# Patient Record
Sex: Female | Born: 1960 | Race: Black or African American | Hispanic: No | Smoking: Current every day smoker
Health system: Southern US, Community
[De-identification: ages and names within clinical notes are randomized; demographics above are authoritative.]

## PROBLEM LIST (undated history)

## (undated) HISTORY — PX: ABDOMINAL HYSTERECTOMY: SHX81

## (undated) HISTORY — PX: CHOLECYSTECTOMY: SHX55

## (undated) HISTORY — PX: APPENDECTOMY: SHX54

---

## 2001-01-24 ENCOUNTER — Other Ambulatory Visit: Admission: RE | Admit: 2001-01-24 | Discharge: 2001-01-24 | Payer: Self-pay | Admitting: Internal Medicine

## 2001-01-28 ENCOUNTER — Ambulatory Visit (HOSPITAL_COMMUNITY): Admission: RE | Admit: 2001-01-28 | Discharge: 2001-01-28 | Payer: Self-pay | Admitting: Internal Medicine

## 2001-01-28 ENCOUNTER — Encounter: Payer: Self-pay | Admitting: Internal Medicine

## 2001-02-06 ENCOUNTER — Inpatient Hospital Stay (HOSPITAL_COMMUNITY): Admission: RE | Admit: 2001-02-06 | Discharge: 2001-02-08 | Payer: Self-pay | Admitting: Internal Medicine

## 2001-02-09 ENCOUNTER — Inpatient Hospital Stay (HOSPITAL_COMMUNITY): Admission: EM | Admit: 2001-02-09 | Discharge: 2001-02-12 | Payer: Self-pay | Admitting: Internal Medicine

## 2001-02-09 ENCOUNTER — Encounter: Payer: Self-pay | Admitting: Internal Medicine

## 2001-02-10 ENCOUNTER — Encounter: Payer: Self-pay | Admitting: Internal Medicine

## 2001-02-11 ENCOUNTER — Encounter: Payer: Self-pay | Admitting: Internal Medicine

## 2001-02-12 ENCOUNTER — Encounter: Payer: Self-pay | Admitting: Internal Medicine

## 2001-08-18 ENCOUNTER — Encounter: Payer: Self-pay | Admitting: Emergency Medicine

## 2001-08-18 ENCOUNTER — Emergency Department (HOSPITAL_COMMUNITY): Admission: EM | Admit: 2001-08-18 | Discharge: 2001-08-18 | Payer: Self-pay | Admitting: Emergency Medicine

## 2002-12-08 ENCOUNTER — Emergency Department (HOSPITAL_COMMUNITY): Admission: EM | Admit: 2002-12-08 | Discharge: 2002-12-08 | Payer: Self-pay | Admitting: *Deleted

## 2002-12-08 ENCOUNTER — Encounter: Payer: Self-pay | Admitting: *Deleted

## 2003-04-18 ENCOUNTER — Emergency Department (HOSPITAL_COMMUNITY): Admission: EM | Admit: 2003-04-18 | Discharge: 2003-04-18 | Payer: Self-pay | Admitting: Emergency Medicine

## 2003-05-27 ENCOUNTER — Encounter: Payer: Self-pay | Admitting: *Deleted

## 2003-05-27 ENCOUNTER — Emergency Department (HOSPITAL_COMMUNITY): Admission: EM | Admit: 2003-05-27 | Discharge: 2003-05-27 | Payer: Self-pay | Admitting: *Deleted

## 2003-07-25 ENCOUNTER — Emergency Department (HOSPITAL_COMMUNITY): Admission: EM | Admit: 2003-07-25 | Discharge: 2003-07-25 | Payer: Self-pay | Admitting: Emergency Medicine

## 2004-09-03 ENCOUNTER — Emergency Department (HOSPITAL_COMMUNITY): Admission: EM | Admit: 2004-09-03 | Discharge: 2004-09-03 | Payer: Self-pay | Admitting: *Deleted

## 2006-02-06 ENCOUNTER — Emergency Department (HOSPITAL_COMMUNITY): Admission: EM | Admit: 2006-02-06 | Discharge: 2006-02-06 | Payer: Self-pay | Admitting: Emergency Medicine

## 2006-04-08 ENCOUNTER — Emergency Department (HOSPITAL_COMMUNITY): Admission: EM | Admit: 2006-04-08 | Discharge: 2006-04-08 | Payer: Self-pay | Admitting: Emergency Medicine

## 2007-03-02 IMAGING — CR DG CHEST 2V
2 series · 2 of 2 positions shown · non-contrast
Comparison: none

CLINICAL DATA: Cough.  
 CHEST - 2 VIEW:
 No comparison films available.

[view not recorded (1 of 2)]
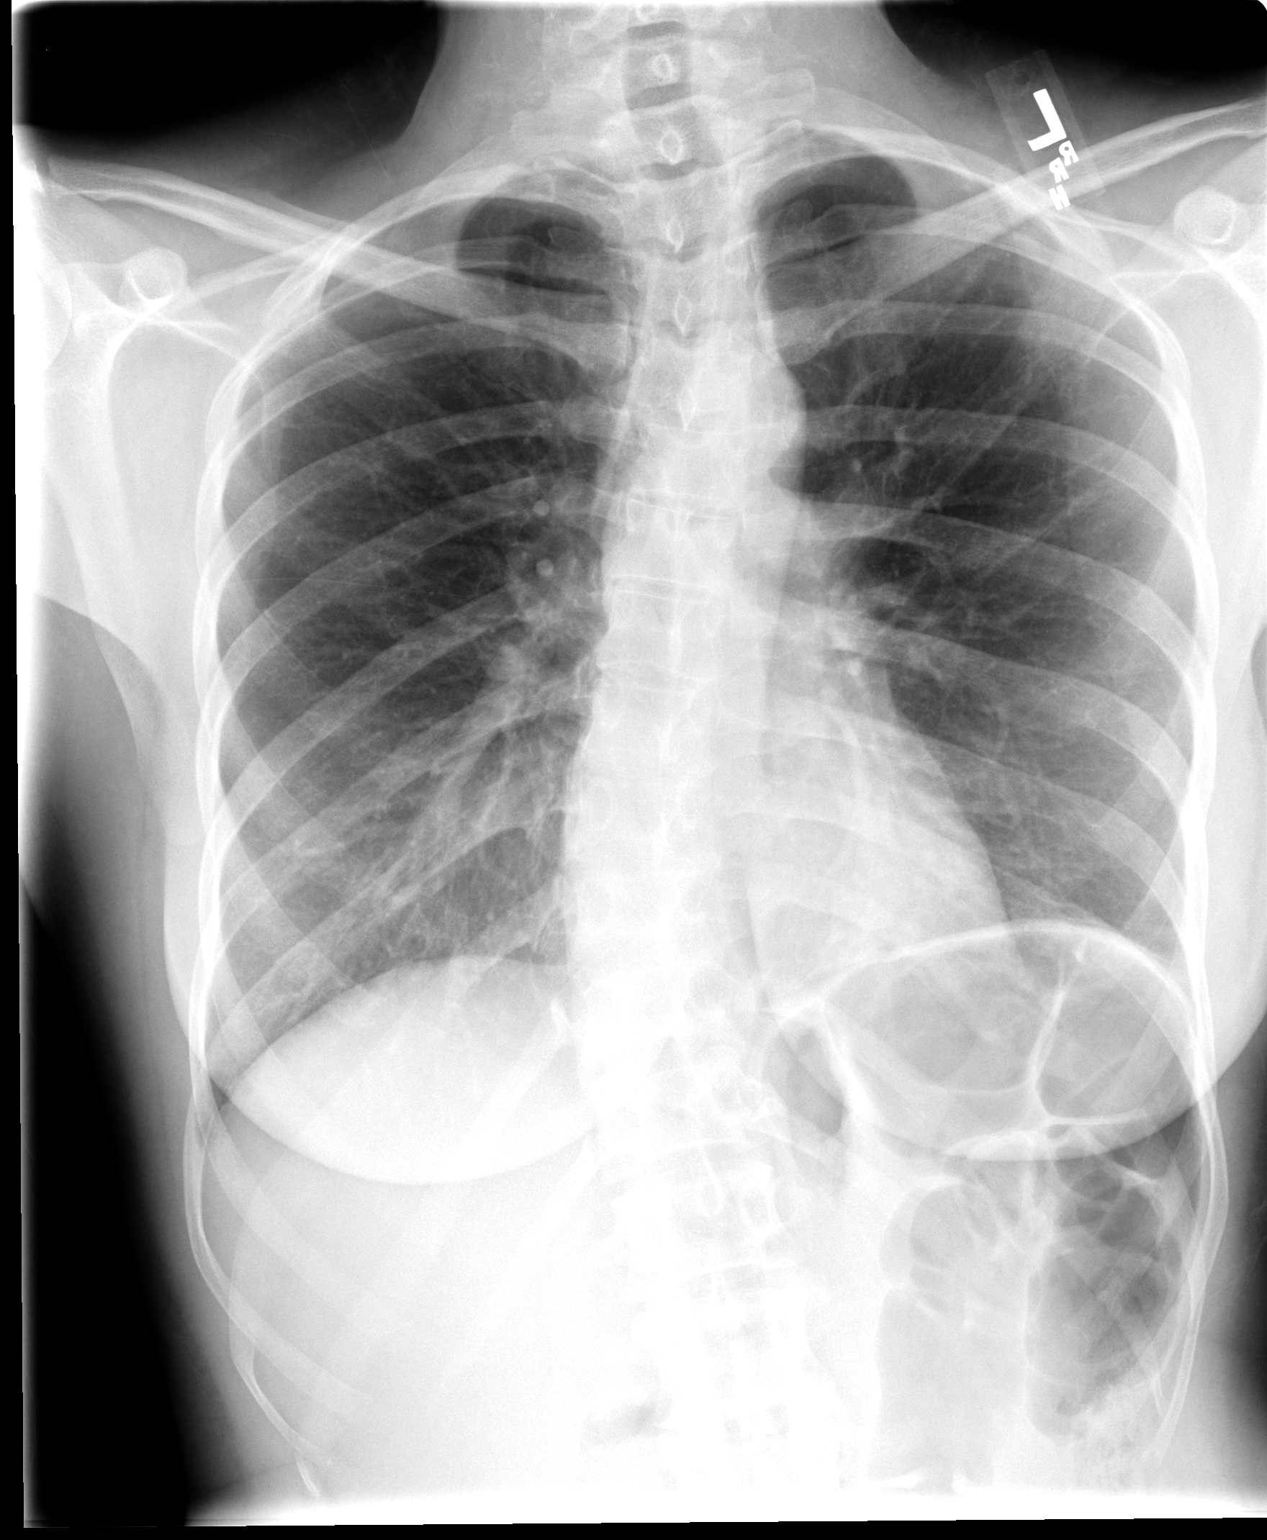

[view not recorded (2 of 2)]
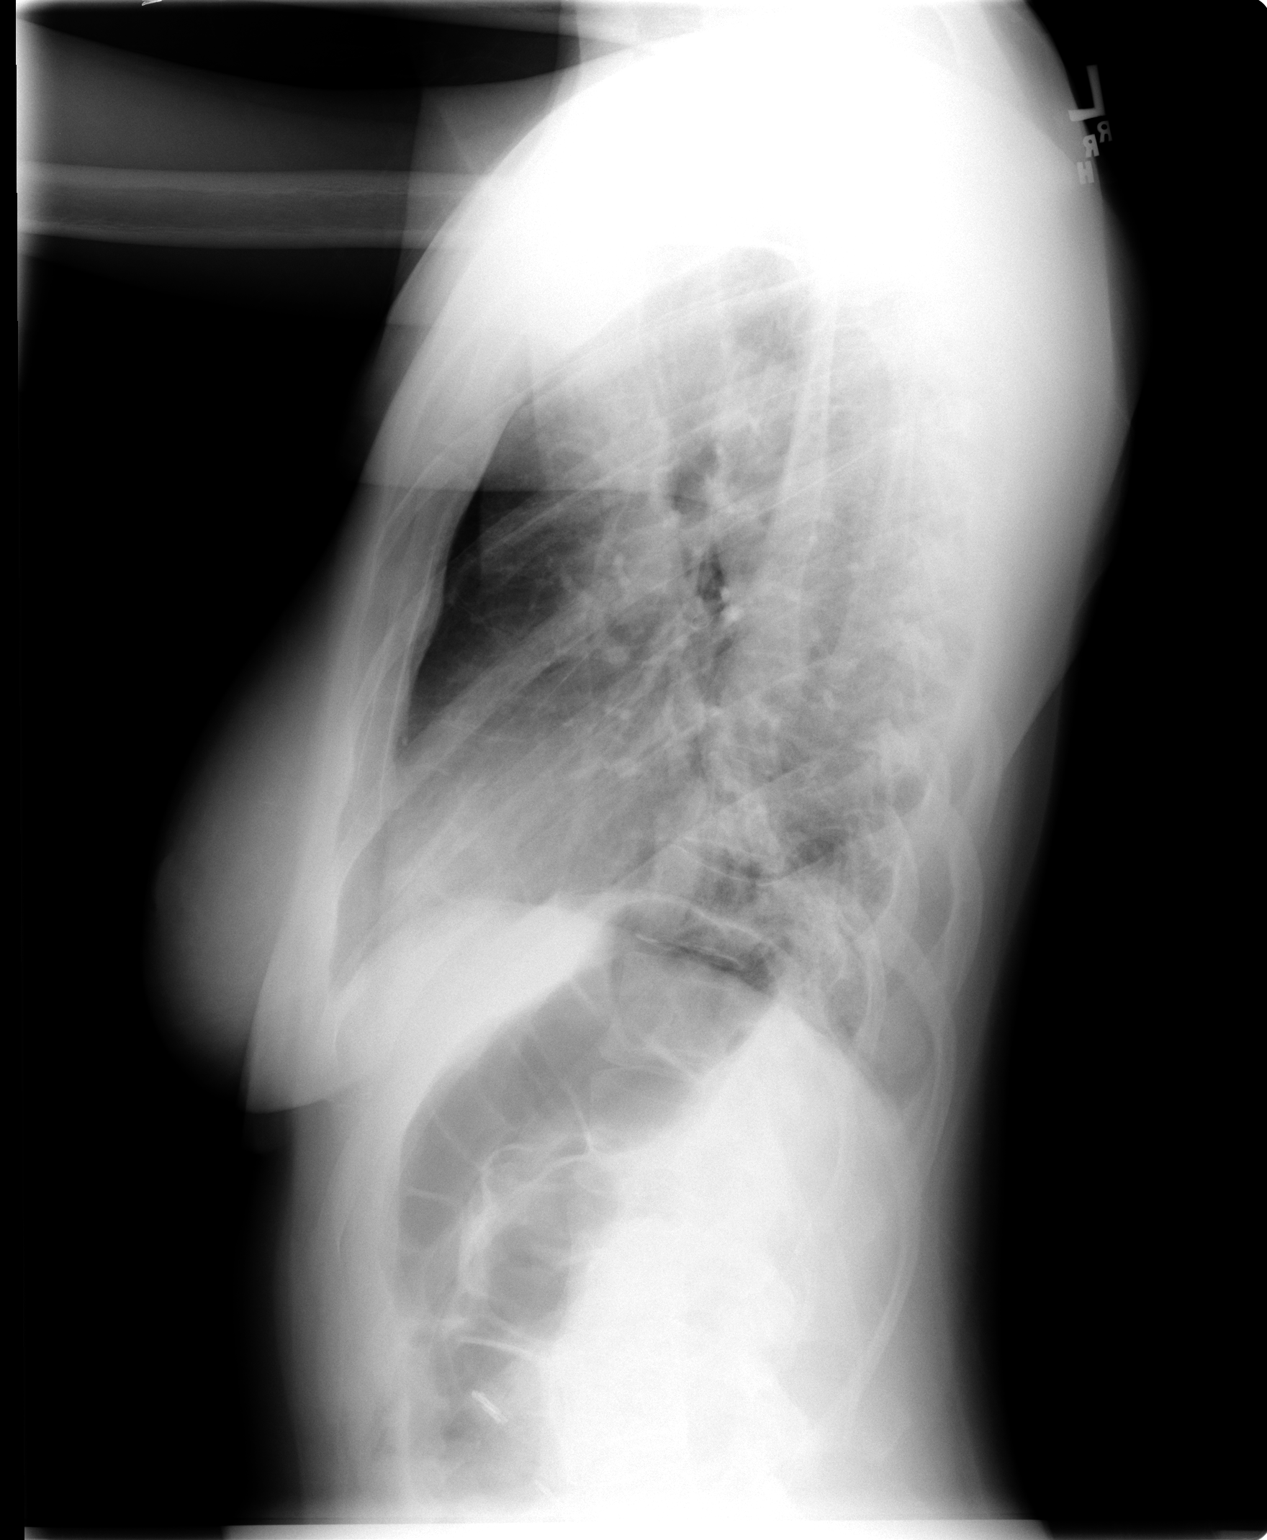

[2 of 2 positions shown; findings below may reference images not displayed]

FINDINGS: There is left lower lobe airspace disease worrisome for pneumonia.  Mild peribronchial thickening is identified.    No evidence of pleural effusion or pneumothorax. Thoracolumbar scoliosis is identified.
IMPRESSION: 1.  Left lower lobe airspace disease suspicious for pneumonia. Recommend follow-up to resolution to exclude other processes. 
 2.  Peribronchial thickening.

## 2007-05-17 ENCOUNTER — Emergency Department (HOSPITAL_COMMUNITY): Admission: EM | Admit: 2007-05-17 | Discharge: 2007-05-17 | Payer: Self-pay | Admitting: Emergency Medicine

## 2013-10-02 ENCOUNTER — Encounter (HOSPITAL_COMMUNITY): Payer: Self-pay | Admitting: Emergency Medicine

## 2013-10-02 ENCOUNTER — Emergency Department (HOSPITAL_COMMUNITY)
Admission: EM | Admit: 2013-10-02 | Discharge: 2013-10-02 | Disposition: A | Discharge status: Home / Self Care | Payer: No Typology Code available for payment source | Attending: Emergency Medicine | Admitting: Emergency Medicine

## 2013-10-02 DIAGNOSIS — IMO0002 Reserved for concepts with insufficient information to code with codable children: Secondary | ICD-10-CM | POA: Insufficient documentation

## 2013-10-02 DIAGNOSIS — M545 Low back pain, unspecified: Secondary | ICD-10-CM

## 2013-10-02 DIAGNOSIS — S4980XA Other specified injuries of shoulder and upper arm, unspecified arm, initial encounter: Secondary | ICD-10-CM | POA: Insufficient documentation

## 2013-10-02 DIAGNOSIS — S46909A Unspecified injury of unspecified muscle, fascia and tendon at shoulder and upper arm level, unspecified arm, initial encounter: Secondary | ICD-10-CM | POA: Insufficient documentation

## 2013-10-02 DIAGNOSIS — Y92410 Unspecified street and highway as the place of occurrence of the external cause: Secondary | ICD-10-CM | POA: Insufficient documentation

## 2013-10-02 DIAGNOSIS — S139XXA Sprain of joints and ligaments of unspecified parts of neck, initial encounter: Secondary | ICD-10-CM | POA: Insufficient documentation

## 2013-10-02 DIAGNOSIS — S161XXA Strain of muscle, fascia and tendon at neck level, initial encounter: Secondary | ICD-10-CM

## 2013-10-02 DIAGNOSIS — F17200 Nicotine dependence, unspecified, uncomplicated: Secondary | ICD-10-CM | POA: Insufficient documentation

## 2013-10-02 DIAGNOSIS — Y9389 Activity, other specified: Secondary | ICD-10-CM | POA: Insufficient documentation

## 2013-10-02 DIAGNOSIS — Z880 Allergy status to penicillin: Secondary | ICD-10-CM | POA: Insufficient documentation

## 2013-10-02 MED ORDER — TRAMADOL HCL 50 MG PO TABS: 50.0000 mg | ORAL_TABLET | Freq: Four times a day (QID) | ORAL | Status: AC | PRN
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
## 2014-06-28 ENCOUNTER — Encounter (HOSPITAL_COMMUNITY): Payer: Self-pay | Admitting: Emergency Medicine

## 2016-01-15 ENCOUNTER — Encounter (HOSPITAL_COMMUNITY): Payer: Self-pay | Admitting: Emergency Medicine

## 2016-01-15 ENCOUNTER — Emergency Department (HOSPITAL_COMMUNITY)
Admission: EM | Admit: 2016-01-15 | Discharge: 2016-01-15 | Disposition: A | Discharge status: Home / Self Care | Payer: No Typology Code available for payment source | Attending: Emergency Medicine | Admitting: Emergency Medicine

## 2016-01-15 DIAGNOSIS — S161XXA Strain of muscle, fascia and tendon at neck level, initial encounter: Secondary | ICD-10-CM | POA: Diagnosis not present

## 2016-01-15 DIAGNOSIS — M542 Cervicalgia: Secondary | ICD-10-CM | POA: Diagnosis present

## 2016-01-15 DIAGNOSIS — F17210 Nicotine dependence, cigarettes, uncomplicated: Secondary | ICD-10-CM | POA: Insufficient documentation

## 2016-01-15 DIAGNOSIS — Y999 Unspecified external cause status: Secondary | ICD-10-CM | POA: Insufficient documentation

## 2016-01-15 DIAGNOSIS — Y939 Activity, unspecified: Secondary | ICD-10-CM | POA: Insufficient documentation

## 2016-01-15 DIAGNOSIS — Z791 Long term (current) use of non-steroidal anti-inflammatories (NSAID): Secondary | ICD-10-CM | POA: Insufficient documentation

## 2016-01-15 DIAGNOSIS — Y92410 Unspecified street and highway as the place of occurrence of the external cause: Secondary | ICD-10-CM | POA: Insufficient documentation

## 2016-01-15 MED ORDER — CYCLOBENZAPRINE HCL 10 MG PO TABS: 10.0000 mg | ORAL_TABLET | Freq: Three times a day (TID) | ORAL | Status: AC | PRN

## 2016-01-15 MED ORDER — IBUPROFEN 600 MG PO TABS: 600.0000 mg | ORAL_TABLET | Freq: Four times a day (QID) | ORAL | Status: AC | PRN
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
## 2019-09-16 ENCOUNTER — Ambulatory Visit: Payer: Self-pay | Attending: Internal Medicine

## 2019-09-16 ENCOUNTER — Other Ambulatory Visit: Payer: Self-pay

## 2019-09-16 DIAGNOSIS — Z20822 Contact with and (suspected) exposure to covid-19: Secondary | ICD-10-CM | POA: Insufficient documentation

## 2019-09-17 ENCOUNTER — Telehealth: Payer: Self-pay | Admitting: *Deleted

## 2019-09-17 LAB — NOVEL CORONAVIRUS, NAA: SARS-CoV-2, NAA: NOT DETECTED

## 2019-09-17 NOTE — Telephone Encounter (Signed)
Patient called for COVID result- notified negative. Link to my chart sent to patient so she can get results for job.
